# Patient Record
Sex: Male | Born: 2000 | Race: Black or African American | Hispanic: No | Marital: Single | Smoking: Never smoker
Health system: Southern US, Community
[De-identification: ages and names within clinical notes are randomized; demographics above are authoritative.]

---
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
## 2022-10-05 ENCOUNTER — Ambulatory Visit: Payer: Medicaid Other | Admitting: Family Medicine
